# Patient Record
Sex: Female | Born: 1935 | Race: Black or African American | Hispanic: No | Marital: Single | Smoking: Current every day smoker
Health system: Southern US, Community
[De-identification: ages and names within clinical notes are randomized; demographics above are authoritative.]

## PROBLEM LIST (undated history)

## (undated) DIAGNOSIS — Z972 Presence of dental prosthetic device (complete) (partial): Secondary | ICD-10-CM

## (undated) DIAGNOSIS — I10 Essential (primary) hypertension: Secondary | ICD-10-CM

---

## 2010-11-27 ENCOUNTER — Ambulatory Visit: Payer: Self-pay | Admitting: Ophthalmology

## 2010-11-27 HISTORY — PX: CATARACT EXTRACTION W/ INTRAOCULAR LENS  IMPLANT, BILATERAL: SHX1307

## 2010-12-10 ENCOUNTER — Ambulatory Visit: Payer: Self-pay | Admitting: Ophthalmology

## 2017-03-16 ENCOUNTER — Encounter: Payer: Self-pay | Admitting: *Deleted

## 2017-03-23 NOTE — Discharge Instructions (Signed)

## 2017-03-24 ENCOUNTER — Ambulatory Visit: Payer: Medicare Other | Admitting: Anesthesiology

## 2017-03-24 ENCOUNTER — Encounter
Admission: RE | Disposition: A | Discharge status: Home / Self Care | Payer: Self-pay | Source: Ambulatory Visit | Attending: Ophthalmology

## 2017-03-24 ENCOUNTER — Ambulatory Visit
Admission: RE | Admit: 2017-03-24 | Discharge: 2017-03-24 | Disposition: A | Discharge status: Home / Self Care | Payer: Medicare Other | Source: Ambulatory Visit | Attending: Ophthalmology | Admitting: Ophthalmology

## 2017-03-24 DIAGNOSIS — I10 Essential (primary) hypertension: Secondary | ICD-10-CM | POA: Diagnosis not present

## 2017-03-24 DIAGNOSIS — G4700 Insomnia, unspecified: Secondary | ICD-10-CM | POA: Insufficient documentation

## 2017-03-24 DIAGNOSIS — H401113 Primary open-angle glaucoma, right eye, severe stage: Secondary | ICD-10-CM | POA: Diagnosis not present

## 2017-03-24 DIAGNOSIS — F17210 Nicotine dependence, cigarettes, uncomplicated: Secondary | ICD-10-CM | POA: Diagnosis not present

## 2017-03-24 HISTORY — DX: Presence of dental prosthetic device (complete) (partial): Z97.2

## 2017-03-24 HISTORY — PX: PHOTOCOAGULATION WITH LASER: SHX6027

## 2017-03-24 HISTORY — DX: Essential (primary) hypertension: I10

## 2017-03-24 SURGERY — PHOTOCOAGULATION, EYE, USING LASER
Anesthesia: Monitor Anesthesia Care | Site: Eye | Laterality: Right | Wound class: Clean

## 2017-03-24 MED ORDER — ALFENTANIL 500 MCG/ML IJ INJ
INJECTION | INTRAVENOUS | Status: DC | PRN
  Administered 2017-03-24: 400 ug via INTRAVENOUS

## 2017-03-24 MED ORDER — LIDOCAINE HCL (PF) 4 % IJ SOLN
INTRAMUSCULAR | Status: DC | PRN
  Administered 2017-03-24: 3.5 mL via OPHTHALMIC

## 2017-03-24 MED ORDER — MIDAZOLAM HCL 2 MG/2ML IJ SOLN
INTRAMUSCULAR | Status: DC | PRN
  Administered 2017-03-24: 1 mg via INTRAVENOUS

## 2017-03-24 MED ORDER — NEOMYCIN-POLYMYXIN-DEXAMETH 3.5-10000-0.1 OP OINT
TOPICAL_OINTMENT | OPHTHALMIC | Status: DC | PRN
  Administered 2017-03-24: 1 via OPHTHALMIC

## 2017-03-24 SURGICAL SUPPLY — 11 items
BANDAGE EYE OVAL (MISCELLANEOUS) ×6 IMPLANT
DEVICE G-PROBE SGL USE (Laser) IMPLANT
DEVICE MICRO PULS P3 SGL USE (Laser) ×3 IMPLANT
G-PROBE SGL USE (Laser)
GAUZE SPONGE 4X4 12PLY STRL (GAUZE/BANDAGES/DRESSINGS) ×3 IMPLANT
NDL RETROBULBAR .5 NSTRL (NEEDLE) ×3 IMPLANT
NEEDLE FILTER BLUNT 18X 1/2SAF (NEEDLE) ×2
NEEDLE FILTER BLUNT 18X1 1/2 (NEEDLE) ×1 IMPLANT
SYR 5ML LL (SYRINGE) ×3 IMPLANT
WATER STERILE IRR 250ML POUR (IV SOLUTION) ×3 IMPLANT
WATER STERILE IRR 500ML POUR (IV SOLUTION) IMPLANT

## 2017-03-24 NOTE — Anesthesia Postprocedure Evaluation (Signed)
Anesthesia Post Note  Patient: Jamie Medina  Procedure(s) Performed: Procedure(s) (LRB): PHOTOCOAGULATION WITH LASER (Right)  Patient location during evaluation: PACU Anesthesia Type: MAC Level of consciousness: awake and alert and oriented Pain management: pain level controlled Vital Signs Assessment: post-procedure vital signs reviewed and stable Respiratory status: spontaneous breathing and nonlabored ventilation Cardiovascular status: stable Postop Assessment: no signs of nausea or vomiting and adequate PO intake Anesthetic complications: no    Estill Batten

## 2017-03-24 NOTE — Anesthesia Preprocedure Evaluation (Signed)
Anesthesia Evaluation  Patient identified by MRN, date of birth, ID band Patient awake    Reviewed: Allergy & Precautions, NPO status , Patient's Chart, lab work & pertinent test results  Airway Mallampati: I  TM Distance: >3 FB Neck ROM: Full    Dental  (+) Partial Lower   Pulmonary Current Smoker,    Pulmonary exam normal        Cardiovascular hypertension, Normal cardiovascular exam     Neuro/Psych    GI/Hepatic negative GI ROS, Neg liver ROS,   Endo/Other  negative endocrine ROS  Renal/GU negative Renal ROS     Musculoskeletal   Abdominal   Peds  Hematology negative hematology ROS (+)   Anesthesia Other Findings   Reproductive/Obstetrics                             Anesthesia Physical Anesthesia Plan  ASA: II  Anesthesia Plan: MAC   Post-op Pain Management:    Induction: Intravenous  Airway Management Planned:   Additional Equipment:   Intra-op Plan:   Post-operative Plan:   Informed Consent: I have reviewed the patients History and Physical, chart, labs and discussed the procedure including the risks, benefits and alternatives for the proposed anesthesia with the patient or authorized representative who has indicated his/her understanding and acceptance.     Plan Discussed with: CRNA  Anesthesia Plan Comments:         Anesthesia Quick Evaluation

## 2017-03-24 NOTE — H&P (Signed)
The History and Physical notes are on paper, have been signed, and are to be scanned. The patient remains stable and unchanged from the H&P.   Previous H&P reviewed, patient examined, and there are no changes.  Marwin Primmer 03/24/2017 12:09 PM

## 2017-03-24 NOTE — Op Note (Signed)
DATE OF SURGERY: 03/24/2017  PREOPERATIVE DIAGNOSES: Severe stage primary open angle glaucoma, right eye.      H40.11x3  POSTOPERATIVE DIAGNOSES: Same  PROCEDURES PERFORMED: Transscleral diode cyclophotocoagulation, right eye  SURGEON: [place] Artemis Loyal, M.D.  ANESTHESIA: Retrobulbar block of Xylocaine and Bupivacaine and Hyaluronidase  COMPLICATIONS: None.  INDICATIONS FOR PROCEDURE: [name] is a 83 y.o. year-old female with uncontrolled primary open angle glaucoma. The risks and benefits of glaucoma surgery were discussed with the patient, and she consented for a diode laser surgery.  PROCEDURE IN DETAIL: The eye for surgery was verified during the time-out procedure in the operating room. A retrobulbar block of lidocaine, Marcaine, and hyaluronidase was done for anesthesia. A micropulse probe was applied to each hemilimbus with the following settings: 2000mW, 31.3% duty cycle, 120 seconds. Maxitrol ointment was applied to the eye. The eye was patched closed. The patient tolerated the procedure well and was transferred to the Post-operative Care Unit in stable condition.

## 2017-03-24 NOTE — Anesthesia Procedure Notes (Signed)
Procedure Name: MAC Performed by: Mayme Genta Pre-anesthesia Checklist: Patient identified, Emergency Drugs available, Suction available, Timeout performed and Patient being monitored Patient Re-evaluated:Patient Re-evaluated prior to inductionOxygen Delivery Method: Nasal cannula Placement Confirmation: positive ETCO2

## 2017-03-24 NOTE — Transfer of Care (Signed)
Immediate Anesthesia Transfer of Care Note  Patient: Jamie Medina  Procedure(s) Performed: Procedure(s) with comments: PHOTOCOAGULATION WITH LASER (Right) - IVA BLOCK  Patient Location: PACU  Anesthesia Type: MAC  Level of Consciousness: awake, alert  and patient cooperative  Airway and Oxygen Therapy: Patient Spontanous Breathing and Patient connected to supplemental oxygen  Post-op Assessment: Post-op Vital signs reviewed, Patient's Cardiovascular Status Stable, Respiratory Function Stable, Patent Airway and No signs of Nausea or vomiting  Post-op Vital Signs: Reviewed and stable  Complications: No apparent anesthesia complications

## 2017-03-25 ENCOUNTER — Encounter: Payer: Self-pay | Admitting: Ophthalmology

## 2017-08-15 ENCOUNTER — Encounter: Payer: Self-pay | Admitting: Emergency Medicine

## 2017-08-15 ENCOUNTER — Emergency Department: Payer: Medicare Other

## 2017-08-15 ENCOUNTER — Emergency Department
Admission: EM | Admit: 2017-08-15 | Discharge: 2017-08-15 | Disposition: A | Discharge status: Other Acute Inpatient Hospital | Payer: Medicare Other | Attending: Emergency Medicine | Admitting: Emergency Medicine

## 2017-08-15 DIAGNOSIS — Y929 Unspecified place or not applicable: Secondary | ICD-10-CM | POA: Insufficient documentation

## 2017-08-15 DIAGNOSIS — W01198A Fall on same level from slipping, tripping and stumbling with subsequent striking against other object, initial encounter: Secondary | ICD-10-CM | POA: Insufficient documentation

## 2017-08-15 DIAGNOSIS — S065X9A Traumatic subdural hemorrhage with loss of consciousness of unspecified duration, initial encounter: Secondary | ICD-10-CM | POA: Insufficient documentation

## 2017-08-15 DIAGNOSIS — Y93K1 Activity, walking an animal: Secondary | ICD-10-CM | POA: Diagnosis not present

## 2017-08-15 DIAGNOSIS — S065XAA Traumatic subdural hemorrhage with loss of consciousness status unknown, initial encounter: Secondary | ICD-10-CM

## 2017-08-15 DIAGNOSIS — M549 Dorsalgia, unspecified: Secondary | ICD-10-CM | POA: Diagnosis not present

## 2017-08-15 DIAGNOSIS — Z79899 Other long term (current) drug therapy: Secondary | ICD-10-CM | POA: Insufficient documentation

## 2017-08-15 DIAGNOSIS — I10 Essential (primary) hypertension: Secondary | ICD-10-CM | POA: Diagnosis not present

## 2017-08-15 DIAGNOSIS — Y999 Unspecified external cause status: Secondary | ICD-10-CM | POA: Diagnosis not present

## 2017-08-15 DIAGNOSIS — S0990XA Unspecified injury of head, initial encounter: Secondary | ICD-10-CM | POA: Diagnosis present

## 2017-08-15 LAB — CBC WITH DIFFERENTIAL/PLATELET
Basophils Absolute: 0.1 10*3/uL (ref 0–0.1)
Basophils Relative: 1 %
Eosinophils Absolute: 0.1 10*3/uL (ref 0–0.7)
Eosinophils Relative: 2 %
HEMATOCRIT: 48.7 % — AB (ref 35.0–47.0)
HEMOGLOBIN: 16.4 g/dL — AB (ref 12.0–16.0)
LYMPHS ABS: 1.2 10*3/uL (ref 1.0–3.6)
LYMPHS PCT: 15 %
MCH: 32.1 pg (ref 26.0–34.0)
MCHC: 33.7 g/dL (ref 32.0–36.0)
MCV: 95.4 fL (ref 80.0–100.0)
MONO ABS: 0.6 10*3/uL (ref 0.2–0.9)
MONOS PCT: 8 %
NEUTROS ABS: 6.3 10*3/uL (ref 1.4–6.5)
NEUTROS PCT: 74 %
Platelets: 221 10*3/uL (ref 150–440)
RBC: 5.11 MIL/uL (ref 3.80–5.20)
RDW: 14.4 % (ref 11.5–14.5)
WBC: 8.4 10*3/uL (ref 3.6–11.0)

## 2017-08-15 LAB — COMPREHENSIVE METABOLIC PANEL
ALT: 16 U/L (ref 14–54)
AST: 26 U/L (ref 15–41)
Albumin: 4.2 g/dL (ref 3.5–5.0)
Alkaline Phosphatase: 83 U/L (ref 38–126)
Anion gap: 13 (ref 5–15)
BILIRUBIN TOTAL: 0.9 mg/dL (ref 0.3–1.2)
BUN: 19 mg/dL (ref 6–20)
CALCIUM: 10.2 mg/dL (ref 8.9–10.3)
CO2: 24 mmol/L (ref 22–32)
CREATININE: 1.27 mg/dL — AB (ref 0.44–1.00)
Chloride: 101 mmol/L (ref 101–111)
GFR calc Af Amer: 41 mL/min — ABNORMAL LOW (ref >60)
GFR calc non Af Amer: 36 mL/min — ABNORMAL LOW (ref >60)
Glucose, Bld: 158 mg/dL — ABNORMAL HIGH (ref 65–99)
POTASSIUM: 3.6 mmol/L (ref 3.5–5.1)
Sodium: 138 mmol/L (ref 135–145)
Total Protein: 8.5 g/dL — ABNORMAL HIGH (ref 6.5–8.1)

## 2017-08-15 LAB — APTT: aPTT: 27 seconds (ref 24–36)

## 2017-08-15 LAB — PROTIME-INR
INR: 0.89
Prothrombin Time: 12 seconds (ref 11.4–15.2)

## 2017-08-15 MED ORDER — MORPHINE SULFATE (PF) 4 MG/ML IV SOLN
4.0000 mg | Freq: Once | INTRAVENOUS | Status: AC
  Administered 2017-08-15: 4 mg via INTRAVENOUS

## 2017-08-15 MED ORDER — CLEVIDIPINE BUTYRATE 0.5 MG/ML IV EMUL: 0.0000 mg/h | INTRAVENOUS | Status: DC

## 2017-08-15 MED ORDER — LABETALOL HCL 5 MG/ML IV SOLN
INTRAVENOUS | Status: AC
  Administered 2017-08-15: 20 mg via INTRAVENOUS
  Filled 2017-08-15: qty 4

## 2017-08-15 MED ORDER — ONDANSETRON HCL 4 MG/2ML IJ SOLN
INTRAMUSCULAR | Status: AC
Start: 2017-08-15 — End: 2017-08-15
  Administered 2017-08-15: 4 mg via INTRAVENOUS
  Filled 2017-08-15: qty 2

## 2017-08-15 MED ORDER — MORPHINE SULFATE (PF) 4 MG/ML IV SOLN
INTRAVENOUS | Status: AC
  Administered 2017-08-15: 4 mg via INTRAVENOUS
  Filled 2017-08-15: qty 1

## 2017-08-15 MED ORDER — LABETALOL HCL 5 MG/ML IV SOLN: 20.0000 mg | Freq: Once | INTRAVENOUS | Status: DC

## 2017-08-15 MED ORDER — NICARDIPINE HCL IN NACL 20-0.86 MG/200ML-% IV SOLN
0.0000 mg/h | INTRAVENOUS | Status: DC
  Administered 2017-08-15: 5 mg/h via INTRAVENOUS
  Filled 2017-08-15: qty 200

## 2017-08-15 MED ORDER — ONDANSETRON HCL 4 MG/2ML IJ SOLN
4.0000 mg | Freq: Once | INTRAMUSCULAR | Status: AC
  Administered 2017-08-15: 4 mg via INTRAVENOUS

## 2017-08-15 MED ORDER — LABETALOL HCL 5 MG/ML IV SOLN
INTRAVENOUS | Status: AC
  Filled 2017-08-15: qty 4

## 2017-08-15 MED ORDER — LABETALOL HCL 5 MG/ML IV SOLN
20.0000 mg | Freq: Once | INTRAVENOUS | Status: AC
  Administered 2017-08-15: 20 mg via INTRAVENOUS

## 2017-08-15 NOTE — ED Notes (Signed)
Pt now states head hurting x 8 days

## 2017-08-15 NOTE — ED Triage Notes (Signed)
Tripped and fell on face. No obvious injury. Pain 7/10. Not on blood thinners

## 2017-08-15 NOTE — ED Notes (Addendum)
Pt seems agitated and unable to sit still compared to when arrived. Called CT to have get pt.  Consulting civil engineer notified.

## 2017-08-15 NOTE — ED Triage Notes (Signed)
FIRST NURSE NOTE-tripped and fell walking dog. Hit head. No blood thinners.  No LOC

## 2017-08-15 NOTE — ED Notes (Signed)
EMTALA AND MEDICAL NECESSITY REVIEWED

## 2017-08-15 NOTE — ED Provider Notes (Addendum)
[facility] Emergency Department Provider Note   ____________________________________________   First MD Initiated Contact with Patient 08/15/17 1738     (approximate)  I have reviewed the triage vital signs and the nursing notes.   HISTORY  Chief Complaint Fall   HPI Jamie Medina is a 83 y.o. female Patient was walking the dog and fell her glasses cut the bridge of her nose. She is now complaining of severe headache and some mid back pain. She is awake and alert and oriented. Blood pressure is going up. Headache is severe.history diffuse.  Past Medical History:  Diagnosis Date  . Hypertension    (169/96 at pre-op) does not see any MD  . Wears dentures    partial lower    There are no active problems to display for this patient.   Past Surgical History:  Procedure Laterality Date  . CATARACT EXTRACTION W/ INTRAOCULAR LENS  IMPLANT, BILATERAL  11/27/2010   12/10/2010  . PHOTOCOAGULATION WITH LASER Right 03/24/2017   Procedure: PHOTOCOAGULATION WITH LASER;  Surgeon: Lockie Mola, MD;  Location[facility];  Service: Ophthalmology;  Laterality: Right;  IVA BLOCK    Prior to Admission medications   Medication Sig Start Date End Date Taking? Authorizing Provider  LATANOPROST OP Apply to eye.    [provider]  TIMOLOL MALEATE OP Apply to eye.    [provider]    Allergies Patient has no known allergies.  History reviewed. No pertinent family history.  Social History Social History  Substance Use Topics  . Smoking status: Current Every Day Smoker    Packs/day: 1.50    Years: 50.00    Types: Cigarettes  . Smokeless tobacco: Never Used  . Alcohol use 4.2 oz/week    7 Shots of liquor per week    Review of Systems  Constitutional: No fever/chills Eyes: No visual changes. ENT: No sore throat. Cardiovascular: Denies chest pain. Respiratory: Denies shortness of breath. Gastrointestinal: No  abdominal pain.  No nausea, no vomiting.  No diarrhea.  No constipation. Genitourinary: Negative for dysuria. Musculoskeletal:  back pain. Skin: Negative for rash. Neurological: Negative for headaches, focal weakness  ____________________________________________   PHYSICAL EXAM:  VITAL SIGNS: ED Triage Vitals  Enc Vitals Group     BP 08/15/17 1653 (!) 168/102     Pulse Rate 08/15/17 1653 85     Resp 08/15/17 1653 16     Temp 08/15/17 1653 98.7 F (37.1 C)     Temp Source 08/15/17 1653 Oral     SpO2 08/15/17 1653 100 %     Weight 08/15/17 1653 124 lb (56.2 kg)     Height --      Head Circumference --      Peak Flow --      Pain Score 08/15/17 1655 7     Pain Loc --      Pain Edu? --      Excl. in GC? --     Constitutional: Alert and oriented.  in  acute distress. Eyes: Conjunctivae are normal.  Head: Atraumatic. Nose: No congestion/rhinnorhea. Mouth/Throat: Mucous membranes are moist.  Oropharynx non-erythematous. Neck: No stridor.  Cardiovascular: Normal rate, regular rhythm. Grossly normal heart sounds.  Good peripheral circulation. Respiratory: Normal respiratory effort.  No retractions. Lungs CTAB. Gastrointestinal: Soft and nontender. No distention. No abdominal bruits. No CVA tenderness. }Musculoskeletal: No lower extremity tenderness nor edema.  No joint effusions.patient is tender in the back from about T10-L2. Neurologic:  Normal speech and language. No gross focal neurologic deficits are appreciated. Skin:  Skin is warm, dry and intact. No rash noted. Psychiatric: Mood and affect are normal. Speech and behavior are normal.  ____________________________________________   LABS (all labs ordered are listed, but only abnormal results are displayed)  Labs Reviewed  COMPREHENSIVE METABOLIC PANEL  CBC WITH DIFFERENTIAL/PLATELET  PROTIME-INR  APTT   ____________________________________________  EKG  EKG read and interpreted by me shows normal sinus rhythm  rate of 65 left axis no acute ST-T wave changes ____________________________________________  RADIOLOGY  CT is read by radiology as a left subdural hematoma 13 mm thick at the maximum there is also probably acute on subacute bleeding there is a bifrontal hygroma with only 2 mm of midline shift to the right C-spine is read as normal____________________________________________   PROCEDURES  Procedure(s) performed: Procedures  Critical Care performed:  critical care__time after an hour. See note below__________________________________________   INITIAL IMPRESSION / ASSESSMENT AND PLAN / ED COURSE patient has no medical history except for glaucoma she takes only eyedrops for medicines. She is complaining of severe headache will try some morphine initially if that doesn't work to get her blood pressure down will try some labetalol. Family wants me to call [facility] or [facility] for treatment of this as for [facility] recalled first we'll do this.    patient's blood pressure continues to go up. Patient's mental status deteriorated somewhat patient is still awake and alert and able to talk to [place] but she is not as vivacious that she was on arrival. Patient says she does not want in any case to be intubated even briefly. Family admits here with her nephew agrees. Patient's blood pressure was up to 15 systolic came down to 197 with 20 of labetalol. is not going up again. We will start a nicardipine drip as we do not have Cleviprex in the hospital until a drip comes up we will give her another 20 of labetalol. She is old and not very large and her heart rate is now in the high 60s or do not want to overdose her on labetalol  we are starting the nicardipine drip. Nurse will go down with EMS. There are no helicopters available and [facility] has no ground units available for at least another 40 minutes. With any luck she'll be down there before that. Patient is still awake alert and oriented but not as I will say vivacious that  she was when she originally got here. Blood pressure had gone from 217/125 down to 197/107 with the labetalol but is going back up again now is 211/125 and nicardipine is been startedI have spoken with the patient and her nephew and nephews wife about the prognosis and possibility of for intubation which the patient refused _discussed with patient nephew and his wife the fact that the patient could easily die from this or  get__brain damage they understand._________________________________________   FINAL CLINICAL IMPRESSION(S) / ED DIAGNOSES  Final diagnoses:  Subdural hematoma (HCC)      NEW MEDICATIONS STARTED DURING THIS VISIT:  New Prescriptions   No medications on file     Note:  This document was prepared using Dragon voice recognition software and may include unintentional dictation errors.    Arnaldo Natal, MD 08/15/17 Berna Spare    Arnaldo Natal, MD 08/15/17 [phone]

## 2017-08-15 NOTE — ED Notes (Signed)
Attempted to ambulate pt to check her legs and hips. Pt grabs her head every time and yells that her head hurts. Changed to level 3 and ct of head ordered.

## 2017-08-15 NOTE — ED Notes (Signed)
Pt currently in CT.

## 2017-08-15 NOTE — ED Notes (Signed)
Pt O2 sat dropped to 87% on Room air, pt was placed on 2 liters O2

## 2017-08-15 NOTE — ED Notes (Addendum)
Note entered at 46 when RN returned to ED from transport  This RN accompanied [facility] on transport of patient from our ED to [facility] ED due to patient being on nicardipine drip. During transport patient remained stable, no airway issues or decline in mental status noted during transport. Patient vital signs and GCS during transport as follows  1906- BP 165/91, HR, 75, O2 sat 100% on 2L via [place], GCS 14  1916- BP 155/99, HR 81, O2 sat 100% on 2L via [place], GCS 14  1926- BP 161/91, HR 79, O2 sat 100% on 2L via [place], GCS 14.  Pt care was handed off to [facility] ED staff by this RN at [phone]

## 2017-09-02 DEATH — deceased

## 2018-11-30 IMAGING — CT CT CERVICAL SPINE W/O CM
3 of 5 series · 15 of 33 positions shown, 17 images · non-contrast
Comparison: None.

CLINICAL DATA: Status post fall with blunt trauma to the face.

EXAM:
CT HEAD WITHOUT CONTRAST
CT CERVICAL SPINE WITHOUT CONTRAST
TECHNIQUE: Multidetector CT imaging of the head and cervical spine was
performed following the standard protocol without intravenous
contrast. Multiplanar CT image reconstructions of the cervical spine
were also generated.

[Series 4: coronal soft tissue · coronal · 0.30mm/px · 3 of 71 slices shown]
[im 15/71  bone]
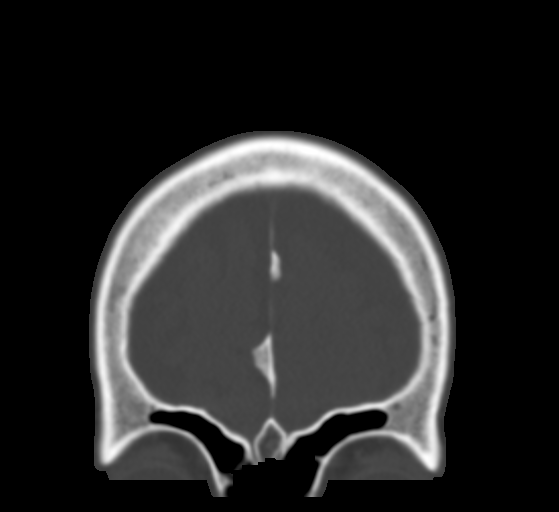
[im 29/71  bone]
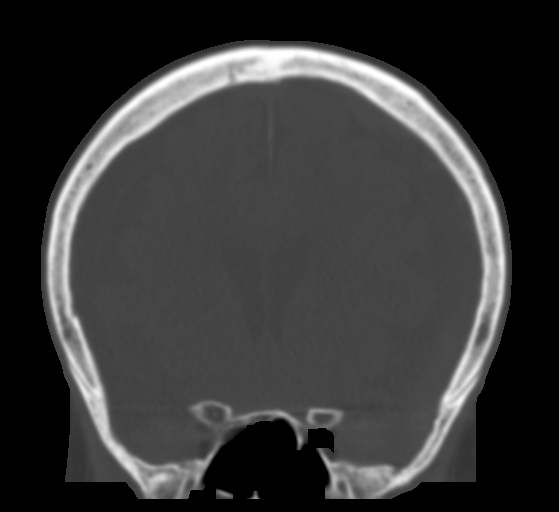
[im 43/71  bone]
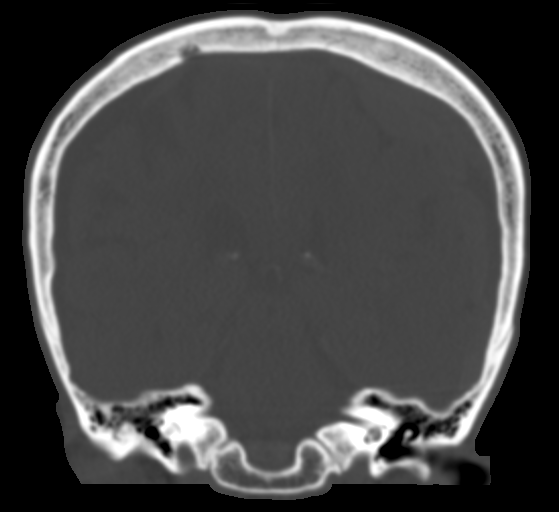

[Series 7: c spine soft · axial · 0.43mm/px · z∈[-272,-162]mm · 7 of 71 slices shown, 9 images]
[im 8/71  soft-tissue]
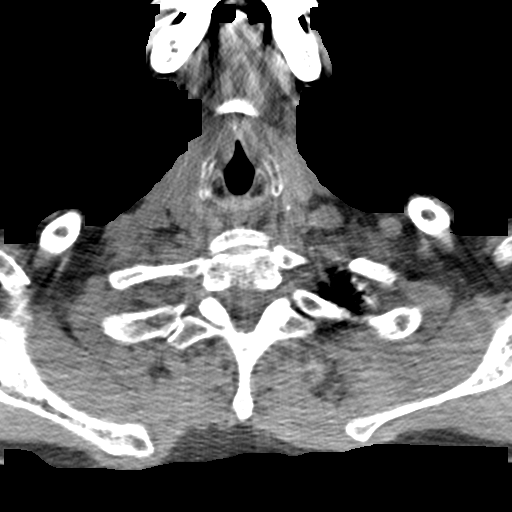
[im 8/71  bone]
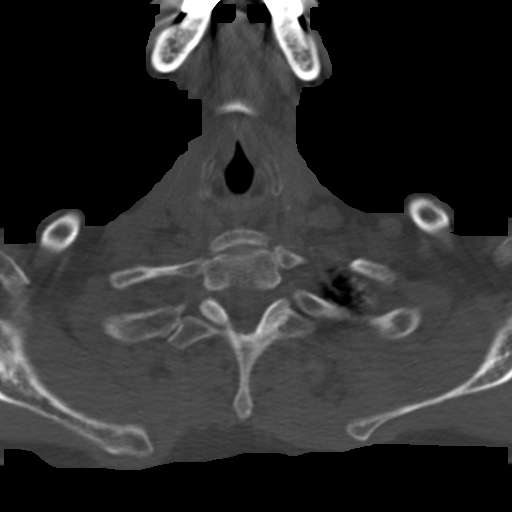
[im 16/71  bone]
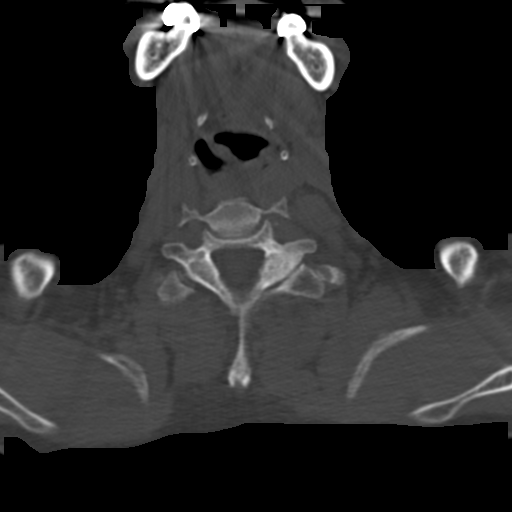
[im 24/71  bone]
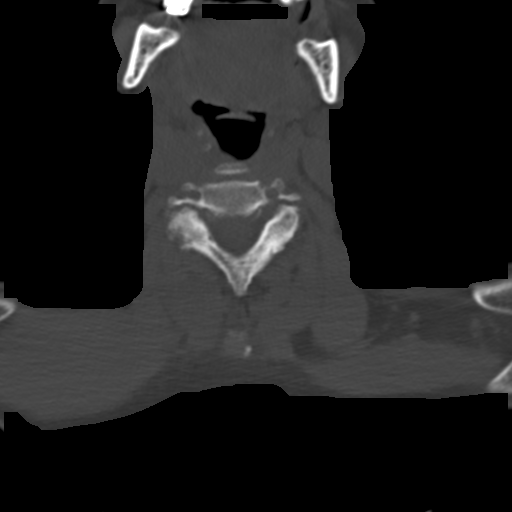
[im 39/71  bone]
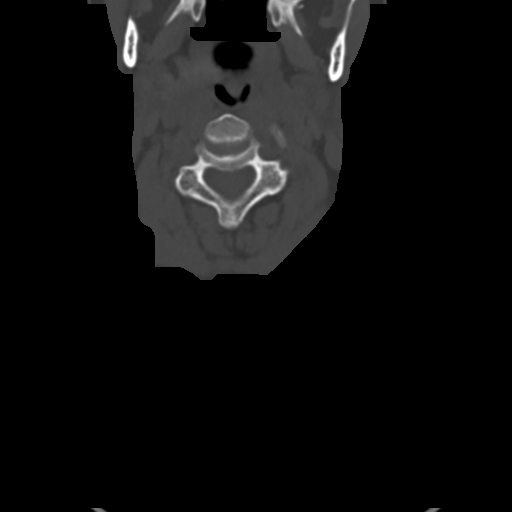
[im 47/71  soft-tissue]
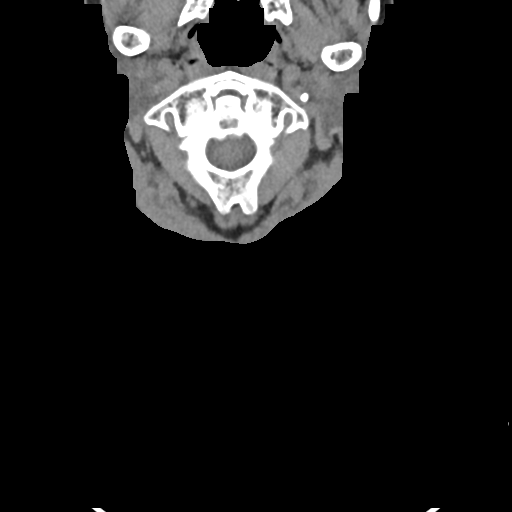
[im 47/71  bone]
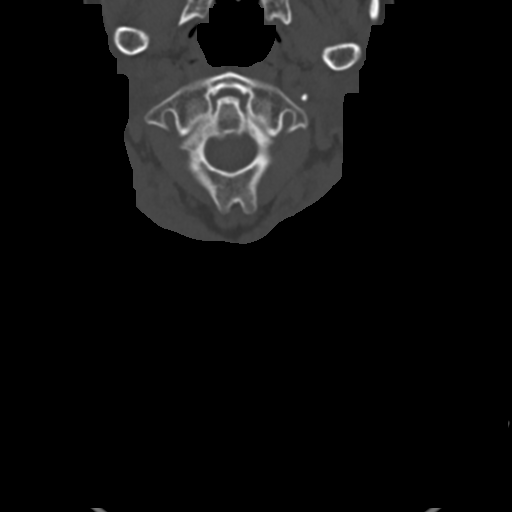
[im 55/71  bone]
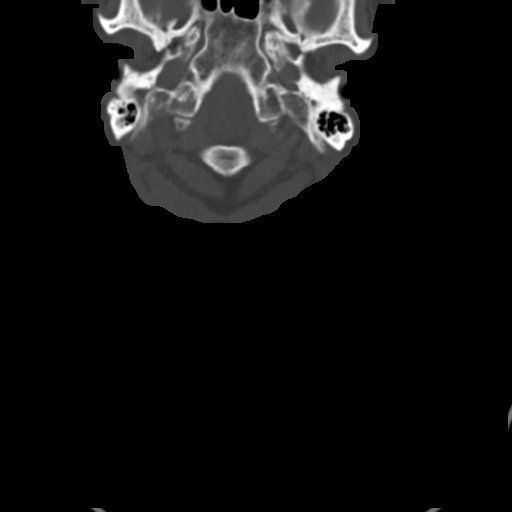
[im 63/71  bone]
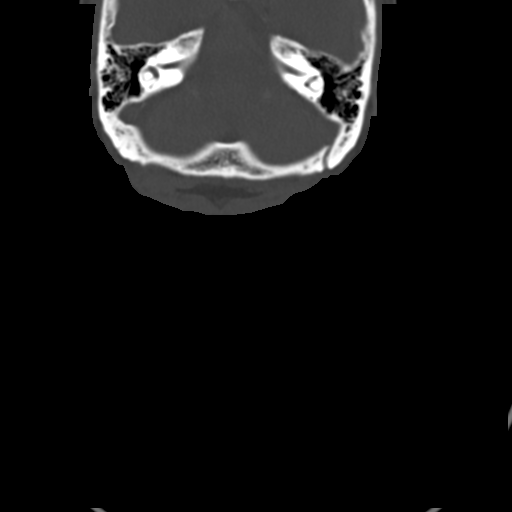

[Series 10: sagittal bone · sagittal · 0.27mm/px · 5 of 63 slices shown]
[im 11/63  bone]
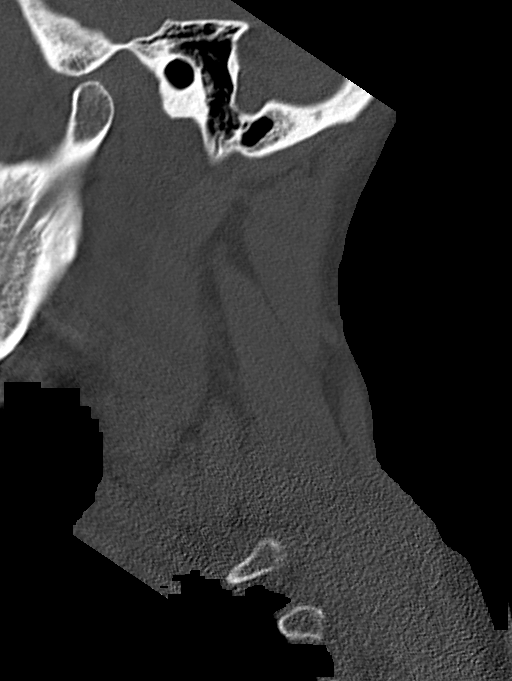
[im 21/63  bone]
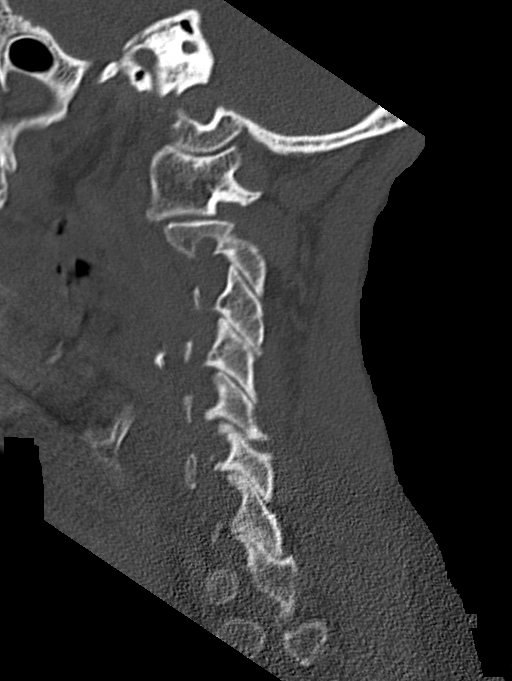
[im 32/63  bone]
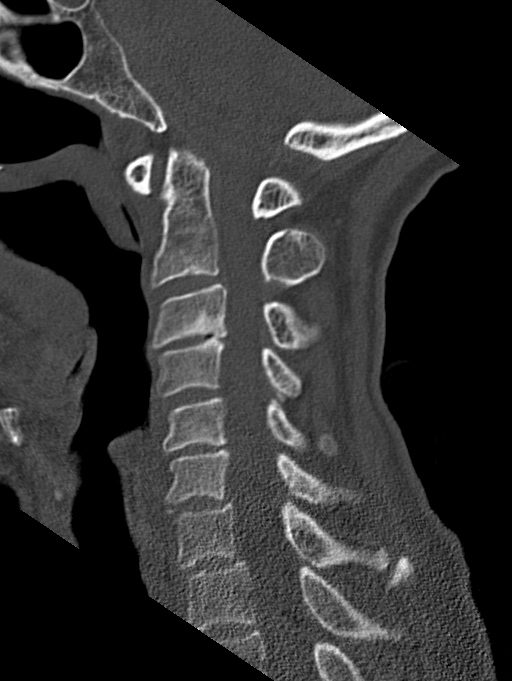
[im 42/63  bone]
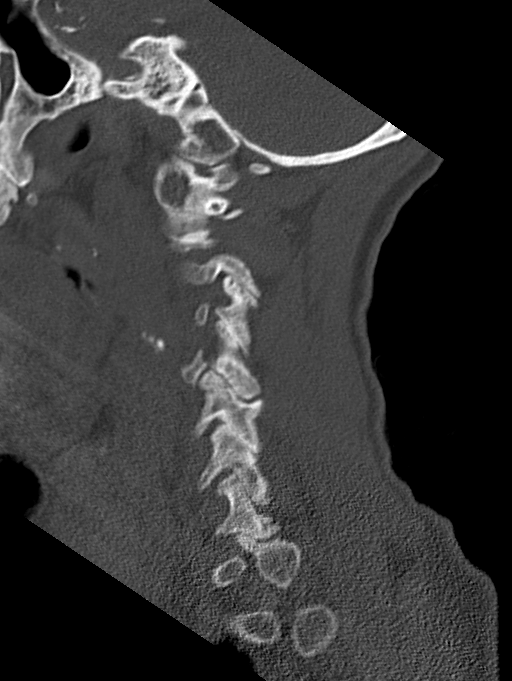
[im 52/63  bone]
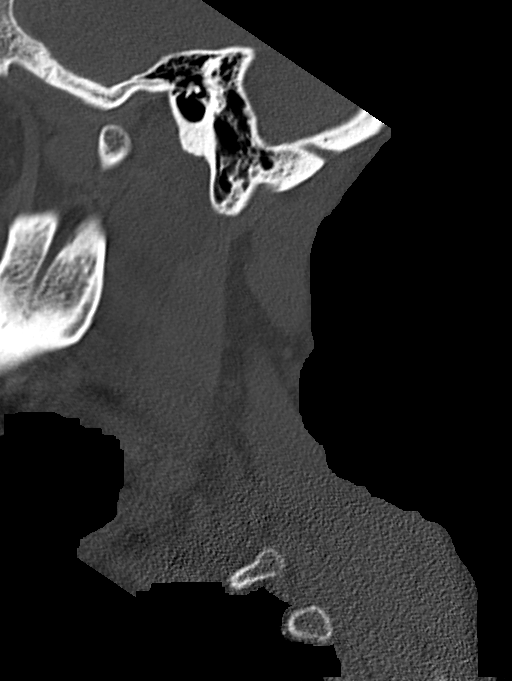

[15 of 33 positions shown; findings below may reference images not displayed]

FINDINGS: CT HEAD FINDINGS

Brain: Large left supratentorial subdural hematoma measuring 13 mm
in maximum thickness. Mixed attenuation of the blood products may
represent acute on subacute hematoma. Small amount of subarachnoid
hemorrhage at the skullbase. No evidence of acute infarction. 2 mm
rightward midline shift. Moderate brain parenchymal volume loss and
periventricular microangiopathy.

Vascular: Calcific atherosclerotic disease at the skullbase.

Skull: Normal. Negative for fracture or focal lesion.

Sinuses/Orbits: No acute finding.

Other: None.

CT CERVICAL SPINE FINDINGS

Alignment: Straining of the cervical lordosis.

Skull base and vertebrae: No acute fracture. No primary bone lesion
or focal pathologic process.

Soft tissues and spinal canal: No prevertebral fluid or swelling. No
visible canal hematoma.

Disc levels:  Multilevel osteoarthritic changes.

Upper chest: Negative.

Other: None.
IMPRESSION: Large left supratentorial mixed density subdural hematoma measuring
13 mm in maximum thickness with associated 2 mm rightward midline
shift.

Small amount of subarachnoid hemorrhage at the skullbase.

Moderate brain parenchymal volume loss and periventricular
microangiopathy.

No evidence of acute traumatic injury to cervical spine.

These results were called by telephone at the time of interpretation
on 08/15/2017 at [DATE] to Dr. YOEL TIGER , who verbally
acknowledged these results.
